# Patient Record
Sex: Female | Born: 1950 | Hispanic: No | Marital: Single | Smoking: Never smoker
Health system: Southern US, Community
[De-identification: ages and names within clinical notes are randomized; demographics above are authoritative.]

---

## 2017-12-14 ENCOUNTER — Emergency Department (HOSPITAL_COMMUNITY): Payer: Worker's Compensation

## 2017-12-14 ENCOUNTER — Emergency Department (HOSPITAL_COMMUNITY)
Admission: EM | Admit: 2017-12-14 | Discharge: 2017-12-14 | Disposition: A | Discharge status: Home / Self Care | Payer: Worker's Compensation | Attending: Emergency Medicine | Admitting: Emergency Medicine

## 2017-12-14 ENCOUNTER — Encounter (HOSPITAL_COMMUNITY): Payer: Self-pay | Admitting: Emergency Medicine

## 2017-12-14 DIAGNOSIS — Y998 Other external cause status: Secondary | ICD-10-CM | POA: Diagnosis not present

## 2017-12-14 DIAGNOSIS — Y939 Activity, unspecified: Secondary | ICD-10-CM | POA: Diagnosis not present

## 2017-12-14 DIAGNOSIS — W01198A Fall on same level from slipping, tripping and stumbling with subsequent striking against other object, initial encounter: Secondary | ICD-10-CM | POA: Diagnosis not present

## 2017-12-14 DIAGNOSIS — S43402A Unspecified sprain of left shoulder joint, initial encounter: Secondary | ICD-10-CM

## 2017-12-14 DIAGNOSIS — S4992XA Unspecified injury of left shoulder and upper arm, initial encounter: Secondary | ICD-10-CM | POA: Diagnosis present

## 2017-12-14 DIAGNOSIS — Y9289 Other specified places as the place of occurrence of the external cause: Secondary | ICD-10-CM | POA: Diagnosis not present

## 2017-12-14 MED ORDER — IBUPROFEN 800 MG PO TABS
800.0000 mg | ORAL_TABLET | Freq: Once | ORAL | Status: AC
  Administered 2017-12-14: 800 mg via ORAL
  Filled 2017-12-14: qty 1

## 2017-12-14 MED ORDER — IBUPROFEN 600 MG PO TABS: 600.0000 mg | ORAL_TABLET | Freq: Four times a day (QID) | ORAL | 0 refills | Status: DC | PRN

## 2017-12-14 MED ORDER — TRAMADOL HCL 50 MG PO TABS: 50.0000 mg | ORAL_TABLET | Freq: Four times a day (QID) | ORAL | 0 refills | Status: DC | PRN

## 2017-12-14 MED ORDER — TRAMADOL HCL 50 MG PO TABS
50.0000 mg | ORAL_TABLET | Freq: Once | ORAL | Status: AC
  Administered 2017-12-14: 50 mg via ORAL
  Filled 2017-12-14: qty 1

## 2017-12-14 NOTE — Discharge Instructions (Signed)
Take motrin for pain.   Take tramadol for severe pain.   You have a shoulder sprain. Wear sling for comfort.   Follow up with orthopedic doctor  Return to ER if you have worse shoulder pain, unable to move your shoulder

## 2017-12-14 NOTE — ED Notes (Signed)
[place] interpreter used for discharge instruction.

## 2017-12-14 NOTE — ED Triage Notes (Signed)
Patient reports fall at work this morning. C/o left shoulder pain. Left radial pulse present.

## 2017-12-14 NOTE — ED Provider Notes (Signed)
[facility]-EMERGENCY DEPT Provider Note   CSN: [id number] Arrival date & time: 12/14/17  2038     History   Chief Complaint Chief Complaint  Patient presents with  . Fall  . Shoulder Injury    HPI Jennesis Ramaswamy is a 67 y.o. female here presenting with left shoulder injury.  She states that she was at work this morning.  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  She has worsening pain as the day went on and came here for evaluation.  She denies any head injury or loss of consciousness.  She denies passing out.   The history is provided by the patient.    History reviewed. No pertinent past medical history.  There are no active problems to display for this patient.   History reviewed. No pertinent surgical history.   OB History   None      Home Medications    Prior to Admission medications   Not on File    Family History No family history on file.  Social History Social History   Tobacco Use  . Smoking status: Not on file  Substance Use Topics  . Alcohol use: Not on file  . Drug use: Not on file     Allergies   Patient has no known allergies.   Review of Systems Review of Systems  Musculoskeletal:       L shoulder pain   All other systems reviewed and are negative.    Physical Exam Updated Vital Signs BP 127/88   Pulse 85   Temp 97.9 F (36.6 C) (Oral)   Resp 17   Ht 5\' 4"  (1.626 m)   Wt 70.4 kg   SpO2 92%   BMI 26.64 kg/m   Physical Exam  Constitutional: She is oriented to person, place, and time.  Slightly uncomfortable   HENT:  Head: Normocephalic and atraumatic.  Mouth/Throat: Oropharynx is clear and moist.  Eyes: Pupils are equal, round, and reactive to light. Conjunctivae and EOM are normal.  Neck: Normal range of motion. Neck supple.  Cardiovascular: Normal rate, regular rhythm and normal heart sounds.    Pulmonary/Chest: Effort normal and breath sounds normal. No stridor. No respiratory distress.  Abdominal: Soft. Bowel sounds are normal. She exhibits no distension. There is no tenderness.  Musculoskeletal:  L deltoid tender. Able to range the L shoulder. No obvious shoulder or humerus deformity. Nl radial pulses, neurovascular intact L upper extremity   Neurological: She is alert and oriented to person, place, and time.  Skin: Skin is warm. Capillary refill takes less than 2 seconds.  Psychiatric: She has a normal mood and affect.  Nursing note and vitals reviewed.    ED Treatments / Results  Labs (all labs ordered are listed, but only abnormal results are displayed) Labs Reviewed - No data to display  EKG None  Radiology Dg Shoulder Left  Result Date: 12/14/2017 CLINICAL DATA:  Fall with shoulder pain EXAM: LEFT SHOULDER - 2+ VIEW COMPARISON:  None. FINDINGS: There is no evidence of fracture or dislocation. There is no evidence of arthropathy or other focal bone abnormality. Soft tissues are unremarkable. IMPRESSION: Negative. Electronically Signed   By: Jasmine Pang M.D.   On: 12/14/2017 21:27    Procedures Procedures (including critical care time)  Medications Ordered in ED Medications  traMADol (ULTRAM) tablet 50 mg (has no administration in time  range)     Initial Impression / Assessment and Plan / ED Course  I have reviewed the triage vital signs and the nursing notes.  Pertinent labs & imaging results that were available during my care of the patient were reviewed by me and considered in my medical decision making (see chart for details).    Sinclaire Artiga is a 67 y.o. female here with L shoulder injury. No obvious deformity, able to range shoulder. Xray showed no dislocation or fracture. Will put on shoulder sling. Likely shoulder sprain vs rotator cuff injury. Will dc home with motrin, tramadol prn. Will have her follow up with ortho outpatient.    Final  Clinical Impressions(s) / ED Diagnoses   Final diagnoses:  None    ED Discharge Orders    None       Charlynne Pander, MD 12/14/17 2155

## 2019-11-22 IMAGING — CR DG SHOULDER 2+V*L*
3 series · 3 of 3 positions shown · non-contrast
Comparison: None.

CLINICAL DATA: Fall with shoulder pain

EXAM:
LEFT SHOULDER - 2+ VIEW

[w shoulder external left]
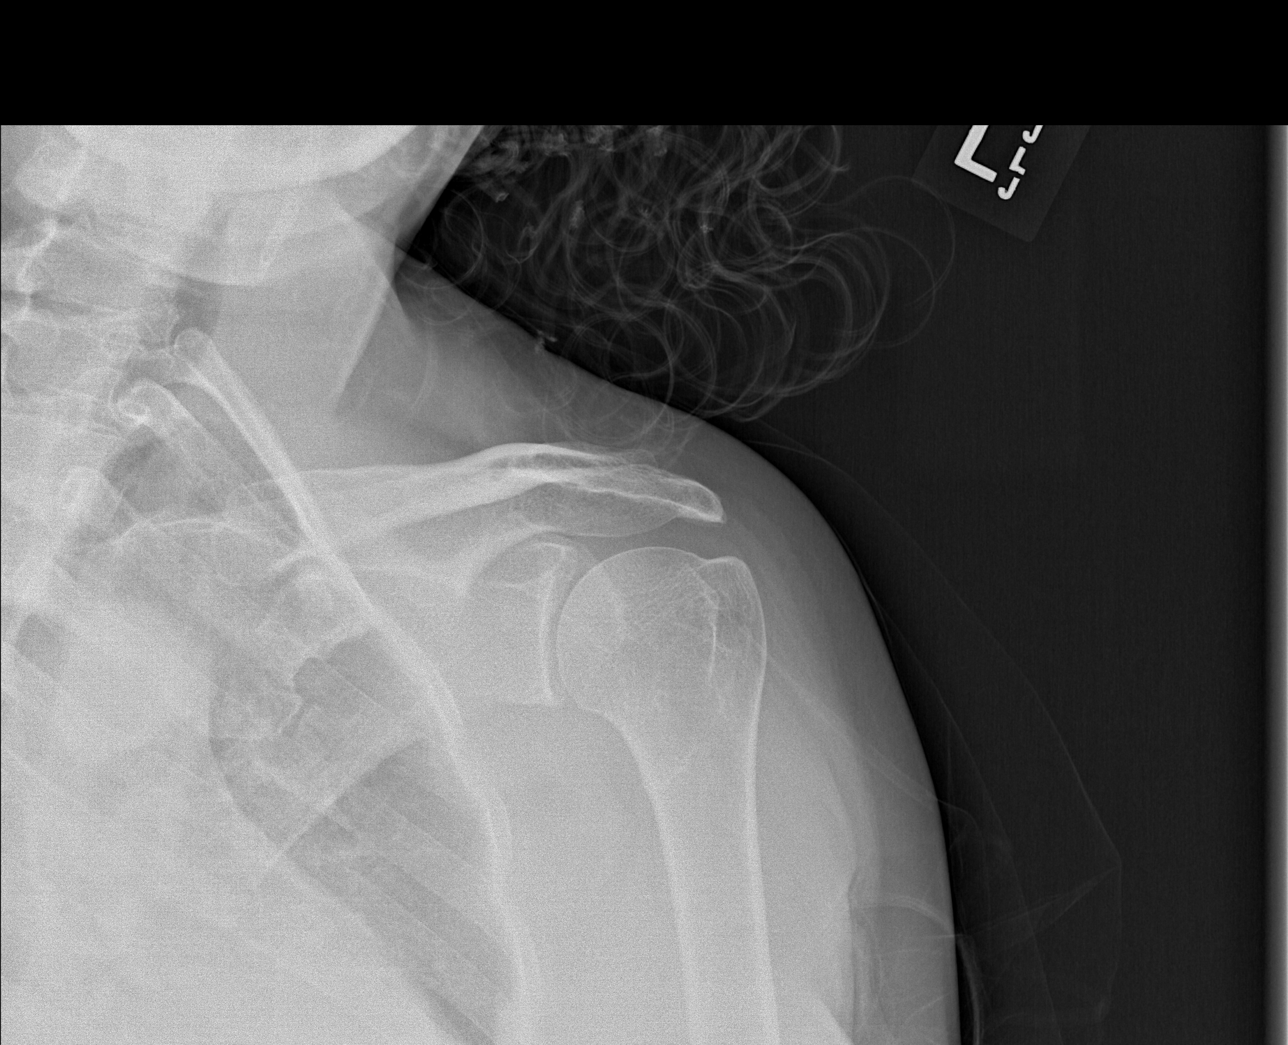

[w shoulder y-view left]
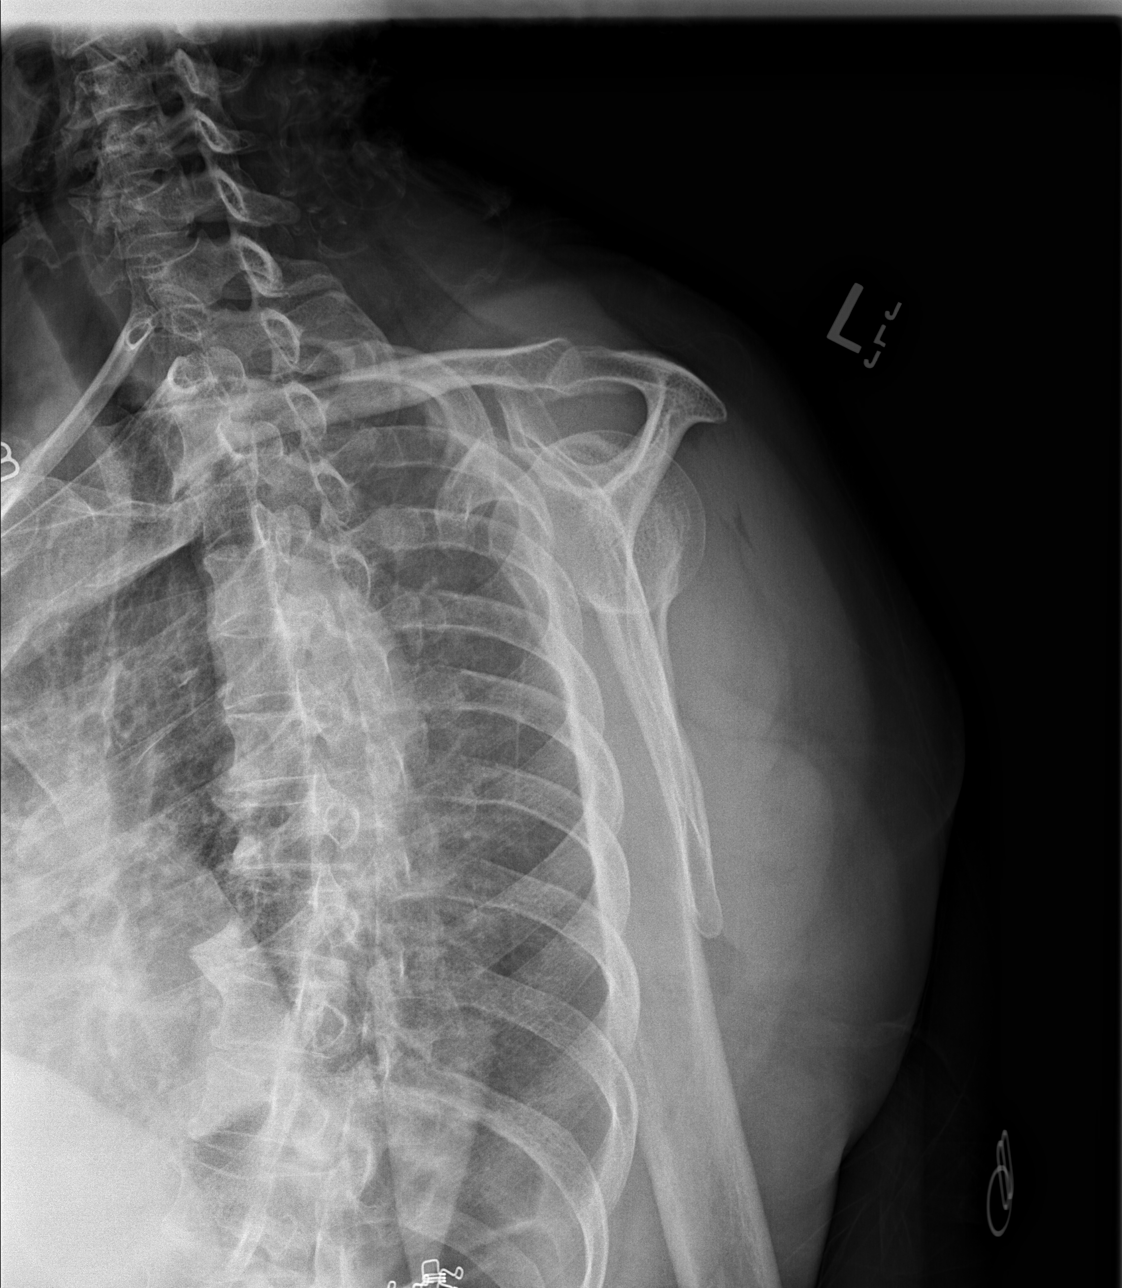

[x shoulder axillary left]
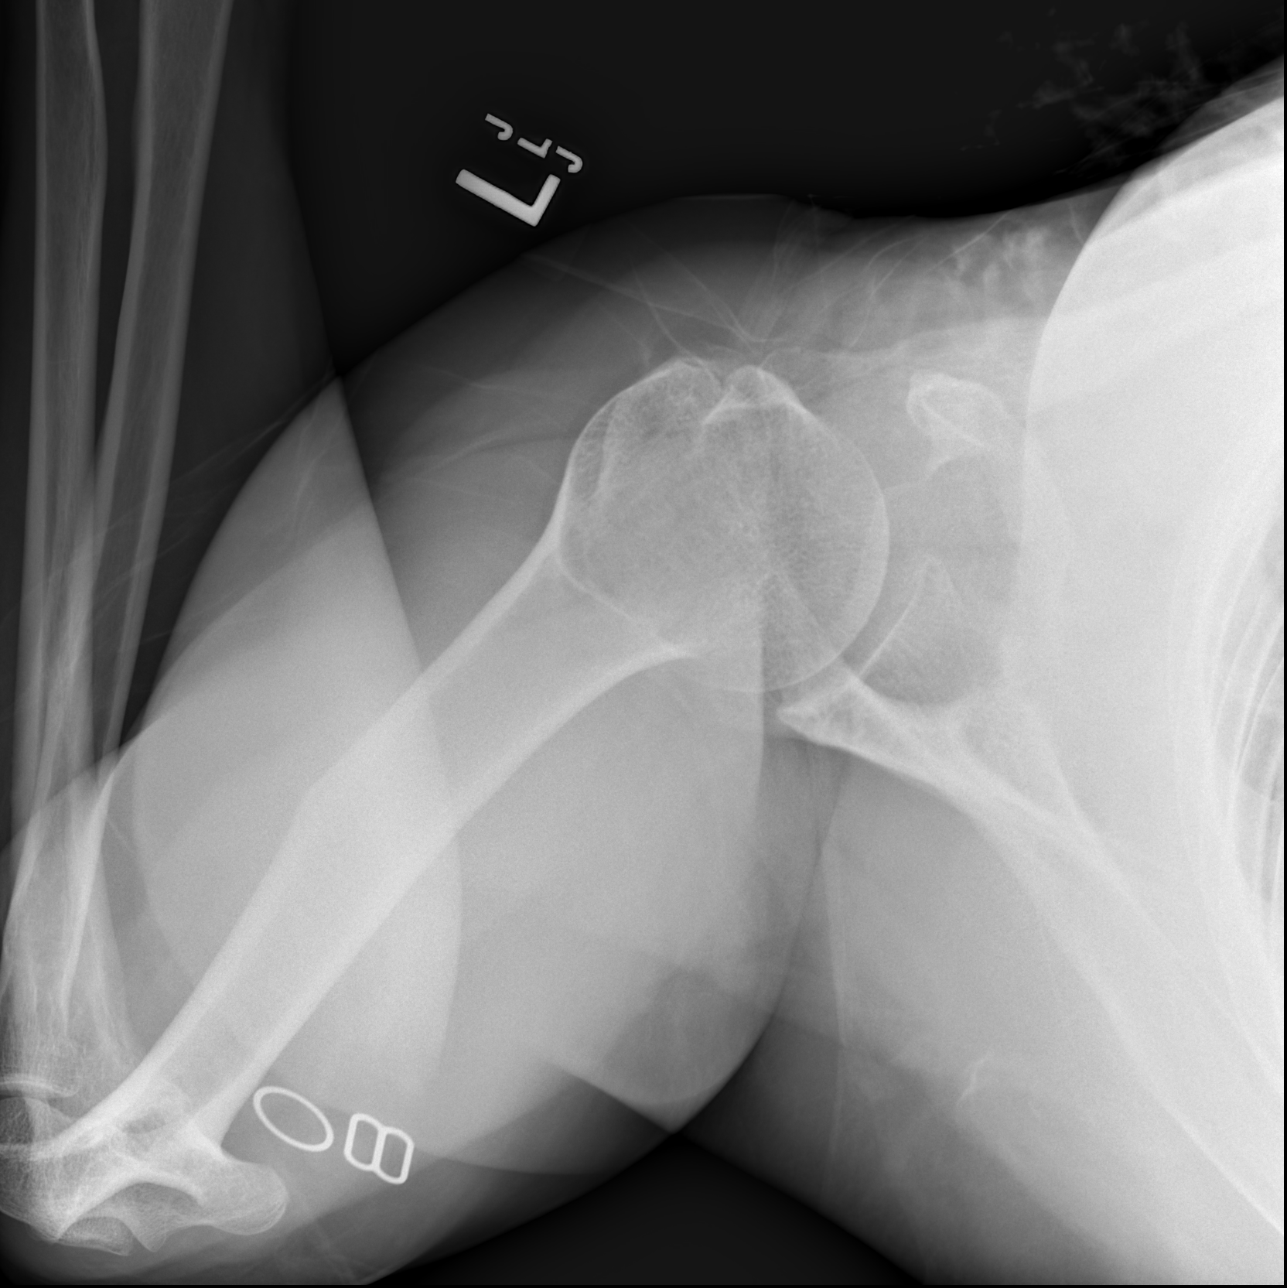

[3 of 3 positions shown; findings below may reference images not displayed]

FINDINGS: There is no evidence of fracture or dislocation. There is no
evidence of arthropathy or other focal bone abnormality. Soft
tissues are unremarkable.
IMPRESSION: Negative.

## 2021-06-12 ENCOUNTER — Encounter (HOSPITAL_COMMUNITY): Payer: Self-pay

## 2021-06-12 ENCOUNTER — Other Ambulatory Visit: Payer: Self-pay

## 2021-06-12 ENCOUNTER — Emergency Department (HOSPITAL_COMMUNITY)
Admission: EM | Admit: 2021-06-12 | Discharge: 2021-06-12 | Disposition: A | Discharge status: Home / Self Care | Payer: Commercial Managed Care - PPO | Attending: Emergency Medicine | Admitting: Emergency Medicine

## 2021-06-12 DIAGNOSIS — E1165 Type 2 diabetes mellitus with hyperglycemia: Secondary | ICD-10-CM | POA: Insufficient documentation

## 2021-06-12 DIAGNOSIS — Z7984 Long term (current) use of oral hypoglycemic drugs: Secondary | ICD-10-CM | POA: Diagnosis not present

## 2021-06-12 DIAGNOSIS — R739 Hyperglycemia, unspecified: Secondary | ICD-10-CM

## 2021-06-12 DIAGNOSIS — Z79899 Other long term (current) drug therapy: Secondary | ICD-10-CM | POA: Diagnosis not present

## 2021-06-12 DIAGNOSIS — I10 Essential (primary) hypertension: Secondary | ICD-10-CM | POA: Diagnosis not present

## 2021-06-12 LAB — BASIC METABOLIC PANEL
Anion gap: 10 (ref 5–15)
BUN: 13 mg/dL (ref 8–23)
CO2: 23 mmol/L (ref 22–32)
Calcium: 9.1 mg/dL (ref 8.9–10.3)
Chloride: 99 mmol/L (ref 98–111)
Creatinine, Ser: 0.71 mg/dL (ref 0.44–1.00)
GFR, Estimated: >60 mL/min (ref >60)
Glucose, Bld: 362 mg/dL — ABNORMAL HIGH (ref 70–99)
Potassium: 3.6 mmol/L (ref 3.5–5.1)
Sodium: 132 mmol/L — ABNORMAL LOW (ref 135–145)

## 2021-06-12 LAB — URINALYSIS, ROUTINE W REFLEX MICROSCOPIC
Bilirubin Urine: NEGATIVE
Glucose, UA: >=500 mg/dL — AB
Hgb urine dipstick: NEGATIVE
Ketones, ur: NEGATIVE mg/dL
Leukocytes,Ua: NEGATIVE
Nitrite: NEGATIVE
Protein, ur: NEGATIVE mg/dL
Specific Gravity, Urine: 1.003 — ABNORMAL LOW (ref 1.005–1.030)
pH: 6 (ref 5.0–8.0)

## 2021-06-12 LAB — CBC
HCT: 38.5 % (ref 36.0–46.0)
Hemoglobin: 12.7 g/dL (ref 12.0–15.0)
MCH: 28.9 pg (ref 26.0–34.0)
MCHC: 33 g/dL (ref 30.0–36.0)
MCV: 87.5 fL (ref 80.0–100.0)
Platelets: 165 10*3/uL (ref 150–400)
RBC: 4.4 MIL/uL (ref 3.87–5.11)
RDW: 11.9 % (ref 11.5–15.5)
WBC: 7.1 10*3/uL (ref 4.0–10.5)
nRBC: 0 % (ref 0.0–0.2)

## 2021-06-12 LAB — CBG MONITORING, ED
Glucose-Capillary: 306 mg/dL — ABNORMAL HIGH (ref 70–99)
Glucose-Capillary: 366 mg/dL — ABNORMAL HIGH (ref 70–99)

## 2021-06-12 MED ORDER — SODIUM CHLORIDE 0.9 % IV BOLUS
1000.0000 mL | Freq: Once | INTRAVENOUS | Status: AC
  Administered 2021-06-12: 1000 mL via INTRAVENOUS

## 2021-06-12 MED ORDER — METFORMIN HCL 500 MG PO TABS: 500.0000 mg | ORAL_TABLET | Freq: Two times a day (BID) | ORAL | 0 refills | Status: DC

## 2021-06-12 MED ORDER — METFORMIN HCL 500 MG PO TABS: 500.0000 mg | ORAL_TABLET | Freq: Once | ORAL | Status: DC

## 2021-06-12 MED ORDER — ACETAMINOPHEN 325 MG PO TABS
650.0000 mg | ORAL_TABLET | Freq: Once | ORAL | Status: AC
  Administered 2021-06-12: 650 mg via ORAL
  Filled 2021-06-12: qty 2

## 2021-06-12 MED ORDER — METFORMIN HCL 500 MG PO TABS
500.0000 mg | ORAL_TABLET | Freq: Once | ORAL | Status: AC
  Administered 2021-06-12: 500 mg via ORAL
  Filled 2021-06-12: qty 1

## 2021-06-12 NOTE — Discharge Instructions (Signed)
You were seen in the emergency department today for not feeling well with high blood sugar.  You were given fluids.  Your other labs were overall reassuring.  We are sending you home with a prescription for metformin, please start taking this again.  Please follow-up with your primary care provider soon as possible.  Return to the ER for new or worsening symptoms or any other concerns. ?

## 2021-06-12 NOTE — ED Notes (Signed)
RN reviewed discharge instructions with pt. Pt verbalized understanding and had no further questions. VSS upon discharge.  

## 2021-06-12 NOTE — ED Triage Notes (Signed)
Pt reports she is a type 2 diabetic and for the past x2 weeks has not had her metformin. Pt stated her glucose is "reading 400's at home". Pt reports headache. Pt denies any N&V. Pt denies any abd pain. Pt glucose is 300's in triage. ?

## 2021-06-12 NOTE — ED Provider Notes (Signed)
?[facility] EMERGENCY DEPARTMENT ?Provider Note ? ? ?CSN: [id number] ?Arrival date & time: 06/12/21  3151 ? ?  ? ?History ? ?Chief Complaint  ?Patient presents with  ? Hyperglycemia  ? ? ?Sally Gross is a 71 y.o. female with a hx of T2DM, hypertension, hyperlipidemia, anemia, and depression who presents to the ED with family for evaluation of hyperglycemia tonight. Patient reports she woke up feeling poorly with a headache, checked her blood sugar and it was elevated in the 400s prompting ED visit. She took some cinnamon and states she is feeling better. She states that she has similar sxs when her sugar is high with headaches and a times blurry vision. She does not check her sugar regularly only if she is feeling poorly. She has been out of her metformin for 1-2 weeks. Denies diplopia, vision loss, numbness, weakness, fever, chest pain, dyspnea, or abdominal pain. Family supplemented history and translated at times, patient declined formal translator.  ? ?HPI ? ?  ? ?Home Medications ?Prior to Admission medications   ?Medication Sig Start Date End Date Taking? Authorizing Provider  ?ibuprofen (ADVIL,MOTRIN) 600 MG tablet Take 1 tablet (600 mg total) by mouth every 6 (six) hours as needed. ?Patient not taking: Reported on 06/12/2021 12/14/17   Charlynne Pander, MD  ?traMADol (ULTRAM) 50 MG tablet Take 1 tablet (50 mg total) by mouth every 6 (six) hours as needed. ?Patient not taking: Reported on 06/12/2021 12/14/17   Charlynne Pander, MD  ?   ? ?Allergies    ?Patient has no known allergies.   ? ?Review of Systems   ?Review of Systems  ?Constitutional:  Negative for chills and fever.  ?Respiratory:  Negative for shortness of breath.   ?Cardiovascular:  Negative for chest pain.  ?Gastrointestinal:  Negative for abdominal pain, blood in stool, nausea and vomiting.  ?Genitourinary:  Negative for dysuria.  ?Neurological:  Positive for headaches. Negative for syncope.  ?All other systems reviewed and  are negative. ? ?Physical Exam ?Updated Vital Signs ?BP 133/79   Pulse 61   Temp 97.7 ?F (36.5 ?C) (Oral)   Resp 17   SpO2 97%  ?Physical Exam ?Vitals and nursing note reviewed.  ?Constitutional:   ?   General: She is not in acute distress. ?   Appearance: Normal appearance. She is not toxic-appearing.  ?HENT:  ?   Head: Normocephalic and atraumatic.  ?   Mouth/Throat:  ?   Pharynx: Oropharynx is clear. Uvula midline.  ?Eyes:  ?   General: Vision grossly intact. Gaze aligned appropriately.  ?   Extraocular Movements: Extraocular movements intact.  ?   Conjunctiva/sclera: Conjunctivae normal.  ?   Pupils: Pupils are equal, round, and reactive to light.  ?   Comments: No proptosis.   ?Cardiovascular:  ?   Rate and Rhythm: Normal rate and regular rhythm.  ?Pulmonary:  ?   Effort: Pulmonary effort is normal.  ?   Breath sounds: Normal breath sounds.  ?Abdominal:  ?   General: There is no distension.  ?   Palpations: Abdomen is soft.  ?   Tenderness: There is no abdominal tenderness. There is no guarding or rebound.  ?Musculoskeletal:  ?   Cervical back: Normal range of motion and neck supple. No rigidity.  ?Skin: ?   General: Skin is warm and dry.  ?Neurological:  ?   Mental Status: She is alert.  ?   Comments: Alert. Clear speech. No facial droop. CNIII-XII grossly intact. Bilateral  upper and lower extremities' sensation grossly intact. 5/5 symmetric strength with grip strength and with plantar and dorsi flexion bilaterally . Normal finger to nose bilaterally. Negative pronator drift. Gait intact.  ?  ?Psychiatric:     ?   Mood and Affect: Mood normal.     ?   Behavior: Behavior normal.  ? ? ?ED Results / Procedures / Treatments   ?Labs ?(all labs ordered are listed, but only abnormal results are displayed) ?Labs Reviewed  ?BASIC METABOLIC PANEL - Abnormal; Notable for the following components:  ?    Result Value  ? Sodium 132 (*)   ? Glucose, Bld 362 (*)   ? All other components within normal limits  ?URINALYSIS,  ROUTINE W REFLEX MICROSCOPIC - Abnormal; Notable for the following components:  ? Color, Urine COLORLESS (*)   ? Specific Gravity, Urine 1.003 (*)   ? Glucose, UA >=500 (*)   ? Bacteria, UA RARE (*)   ? All other components within normal limits  ?CBG MONITORING, ED - Abnormal; Notable for the following components:  ? Glucose-Capillary 306 (*)   ? All other components within normal limits  ?CBC  ?CBG MONITORING, ED  ? ? ?EKG ?None ? ?Radiology ?No results found. ? ?Procedures ?Procedures  ? ? ?Medications Ordered in ED ?Medications  ?sodium chloride 0.9 % bolus 1,000 mL (1,000 mLs Intravenous New Bag/Given 06/12/21 0422)  ?acetaminophen (TYLENOL) tablet 650 mg (650 mg Oral Given 06/12/21 0423)  ? ? ?ED Course/ Medical Decision Making/ A&P ?  ?                        ?Medical Decision Making ?Amount and/or Complexity of Data Reviewed ?Labs: ordered. ? ?Risk ?OTC drugs. ?Prescription drug management. ? ? ?Patient presents to the ED with complaints of hyperglycemia, this involves an extensive number of treatment options, and is a complaint that carries with it a high risk of complications and morbidity. Nontoxic, vitals w/ elevated BP initially- normalized w/ repeat vital signs.   ? ?Additional history obtained:  ?Chart & nursing note reviewed.  ?Patient's family.  ? ?Lab Tests:  ?I reviewed & interpreted labs including:  ?CBC: Unremarkable.  ?BMP: hyperglycemia without acidosis or anion gap elevation.  ?UA: No UTI, glucosuria without ketonuria.  ? ?ED Course:  ?Patient given fluids, Tylenol, and a dose of her metformin in the emergency department.  Her work-up has been overall reassuring, labs/presentation not consistent with DKA or HHS.  No critical electrolyte derangement.  No focal neurologic deficits on exam and her headache was fairly resolved prior to ED arrival.  She is unsure of the dose of metformin, upon external record review for additional history most recent PCP note from 2021 shows that she was on 500 mg  twice daily, will restart this and have her follow-up very closely with her primary care provider. I discussed results, treatment plan, need for follow-up, and return precautions with the patient and faily. Provided opportunity for questions, patient & family confirmed understanding and are in agreement with plan.  ? ?Portions of this note were generated with Scientist, clinical (histocompatibility and immunogenetics). Dictation errors may occur despite best attempts at proofreading. ? ? ?Final Clinical Impression(s) / ED Diagnoses ?Final diagnoses:  ?Hyperglycemia  ? ? ?Rx / DC Orders ?ED Discharge Orders   ? ?      Ordered  ?  metFORMIN (GLUCOPHAGE) 500 MG tablet  2 times daily with meals       ?  06/12/21 0625  ? ?  ?  ? ?  ? ? ?  ?Cherly Andersonetrucelli, Judithe Keetch R, PA-C ?06/12/21 [phone] ? ?  ?Sabas SousBero, Michael M, MD ?06/12/21 [phone] ? ?

## 2022-01-12 ENCOUNTER — Emergency Department (HOSPITAL_COMMUNITY)
Admission: EM | Admit: 2022-01-12 | Discharge: 2022-01-12 | Disposition: A | Discharge status: Home / Self Care | Payer: Commercial Managed Care - PPO | Attending: Emergency Medicine | Admitting: Emergency Medicine

## 2022-01-12 ENCOUNTER — Other Ambulatory Visit: Payer: Self-pay

## 2022-01-12 DIAGNOSIS — I10 Essential (primary) hypertension: Secondary | ICD-10-CM | POA: Insufficient documentation

## 2022-01-12 DIAGNOSIS — R739 Hyperglycemia, unspecified: Secondary | ICD-10-CM

## 2022-01-12 DIAGNOSIS — Z7984 Long term (current) use of oral hypoglycemic drugs: Secondary | ICD-10-CM | POA: Insufficient documentation

## 2022-01-12 DIAGNOSIS — Z760 Encounter for issue of repeat prescription: Secondary | ICD-10-CM | POA: Insufficient documentation

## 2022-01-12 DIAGNOSIS — E1165 Type 2 diabetes mellitus with hyperglycemia: Secondary | ICD-10-CM | POA: Diagnosis present

## 2022-01-12 LAB — URINALYSIS, ROUTINE W REFLEX MICROSCOPIC
Bilirubin Urine: NEGATIVE
Glucose, UA: 50 mg/dL — AB
Hgb urine dipstick: NEGATIVE
Ketones, ur: NEGATIVE mg/dL
Leukocytes,Ua: NEGATIVE
Nitrite: NEGATIVE
Protein, ur: NEGATIVE mg/dL
Specific Gravity, Urine: 1.004 — ABNORMAL LOW (ref 1.005–1.030)
pH: 7 (ref 5.0–8.0)

## 2022-01-12 LAB — CBC
HCT: 39.3 % (ref 36.0–46.0)
Hemoglobin: 13.1 g/dL (ref 12.0–15.0)
MCH: 29.6 pg (ref 26.0–34.0)
MCHC: 33.3 g/dL (ref 30.0–36.0)
MCV: 88.9 fL (ref 80.0–100.0)
Platelets: 158 10*3/uL (ref 150–400)
RBC: 4.42 MIL/uL (ref 3.87–5.11)
RDW: 11.4 % — ABNORMAL LOW (ref 11.5–15.5)
WBC: 7.3 10*3/uL (ref 4.0–10.5)
nRBC: 0 % (ref 0.0–0.2)

## 2022-01-12 LAB — BASIC METABOLIC PANEL
Anion gap: 14 (ref 5–15)
BUN: 8 mg/dL (ref 8–23)
CO2: 23 mmol/L (ref 22–32)
Calcium: 9.3 mg/dL (ref 8.9–10.3)
Chloride: 98 mmol/L (ref 98–111)
Creatinine, Ser: 0.66 mg/dL (ref 0.44–1.00)
GFR, Estimated: >60 mL/min (ref >60)
Glucose, Bld: 249 mg/dL — ABNORMAL HIGH (ref 70–99)
Potassium: 3.7 mmol/L (ref 3.5–5.1)
Sodium: 135 mmol/L (ref 135–145)

## 2022-01-12 LAB — CBG MONITORING, ED
Glucose-Capillary: 247 mg/dL — ABNORMAL HIGH (ref 70–99)
Glucose-Capillary: 225 mg/dL — ABNORMAL HIGH (ref 70–99)

## 2022-01-12 MED ORDER — METFORMIN HCL 500 MG PO TABS: 500.0000 mg | ORAL_TABLET | Freq: Two times a day (BID) | ORAL | 0 refills | Status: AC

## 2022-01-12 MED ORDER — METFORMIN HCL 500 MG PO TABS
500.0000 mg | ORAL_TABLET | Freq: Once | ORAL | Status: AC
Start: 2022-01-12 — End: 2022-01-12
  Administered 2022-01-12: 500 mg via ORAL
  Filled 2022-01-12: qty 1

## 2022-01-12 MED ORDER — SODIUM CHLORIDE 0.9 % IV BOLUS
500.0000 mL | Freq: Once | INTRAVENOUS | Status: AC
  Administered 2022-01-12: 500 mL via INTRAVENOUS

## 2022-01-12 NOTE — ED Triage Notes (Signed)
Pt reports high blood sugars today in the high 300s  Noticed this after she became dizzy and had nausea/vomiting. No CP, fever, chills, or diarrhea. No falls

## 2022-01-12 NOTE — ED Provider Notes (Signed)
Accepted handoff at shift change from Army Melia PA-C. Please see prior provider note for more detail.   Briefly: Patient is 71 y.o.   DDX: concern for generalized weakness, high blood sugar in the high 300s, some intermittent nausea, vomiting, but denies chest pain, fever, chills, diarrhea.  At time of handoff patient with no focal or unilateral neurologic deficits.  She is indeed hyperglycemic, however blood sugar improving after gentle fluid bolus.  We are pending urinalysis.  I independently interpreted urinalysis which shows no evidence of urinary tract infection at this time.  Plan: Patient be discharged with refill of her metformin, and close PCP follow-up.  Patient understands and agrees to plan, and is discharged in stable condition at this time.  Reports weakness improved, and is tolerating p.o. at time of discharge.      RISR  EDTHIS    Olene Floss, PA-C 01/12/22 2349    Nira Conn, MD 01/14/22 1820

## 2022-01-12 NOTE — ED Provider Notes (Signed)
[facility] EMERGENCY DEPARTMENT Provider Note   CSN: [id number] Arrival date & time: 01/12/22  2105     History  Chief Complaint  Patient presents with   Hyperglycemia    Sally Gross is a 71 y.o. female.  71 year old female with history of hyperlipidemia and diabetes brought in by sister with concern for not feeling well today.  Patient states she is been out of her metformin for the past month, out of her cholesterol medication for the last 2 weeks.  Patient states that she went to her doctor however new prescriptions were not sent to her pharmacy for unknown reasons.  States her normal blood sugar runs in the 120s, today CBG 300 at home, she felt dizzy (light headed), generally weak, nauseous which prompted her to come to the ER.   Patient's sister translates for her, patient declines formal translator.       Home Medications Prior to Admission medications   Medication Sig Start Date End Date Taking? Authorizing Provider  metFORMIN (GLUCOPHAGE) 500 MG tablet Take 1 tablet (500 mg total) by mouth 2 (two) times daily with a meal. 01/12/22   Jeannie Fend, PA-C      Allergies    Patient has no known allergies.    Review of Systems   Review of Systems Negative except as per HPI Physical Exam Updated Vital Signs BP (!) 143/76   Pulse (!) 55   Temp 98.1 F (36.7 C)   Resp 17   SpO2 99%  Physical Exam Vitals and nursing note reviewed.  Constitutional:      General: She is not in acute distress.    Appearance: She is well-developed. She is not diaphoretic.  HENT:     Head: Normocephalic and atraumatic.  Eyes:     Extraocular Movements: Extraocular movements intact.     Pupils: Pupils are equal, round, and reactive to light.  Cardiovascular:     Rate and Rhythm: Normal rate and regular rhythm.     Pulses: Normal pulses.     Heart sounds: Normal heart sounds.  Pulmonary:     Effort: Pulmonary effort is normal.     Breath sounds: Normal  breath sounds.  Abdominal:     Palpations: Abdomen is soft.     Tenderness: There is no abdominal tenderness.  Musculoskeletal:     Right lower leg: No edema.     Left lower leg: No edema.  Skin:    General: Skin is warm and dry.     Findings: No erythema or rash.  Neurological:     Mental Status: She is alert and oriented to person, place, and time.     Cranial Nerves: No cranial nerve deficit.     Sensory: No sensory deficit.     Motor: No weakness.  Psychiatric:        Behavior: Behavior normal.     ED Results / Procedures / Treatments   Labs (all labs ordered are listed, but only abnormal results are displayed) Labs Reviewed  BASIC METABOLIC PANEL - Abnormal; Notable for the following components:      Result Value   Glucose, Bld 249 (*)    All other components within normal limits  CBC - Abnormal; Notable for the following components:   RDW 11.4 (*)    All other components within normal limits  CBG MONITORING, ED - Abnormal; Notable for the following components:   Glucose-Capillary 247 (*)    All other components within normal limits  CBG MONITORING, ED - Abnormal; Notable for the following components:   Glucose-Capillary 225 (*)    All other components within normal limits  URINALYSIS, ROUTINE W REFLEX MICROSCOPIC    EKG EKG Interpretation  Date/Time:  Thursday January 12 2022 22:29:23 EST Ventricular Rate:  57 PR Interval:  138 QRS Duration: 87 QT Interval:  451 QTC Calculation: 440 R Axis:   39 Text Interpretation: Sinus or ectopic atrial rhythm Confirmed by Lorre Nick ([id number]) on 01/12/2022 11:17:52 PM  Radiology No results found.  Procedures Procedures    Medications Ordered in ED Medications  sodium chloride 0.9 % bolus 500 mL (0 mLs Intravenous Stopped 01/12/22 2255)  metFORMIN (GLUCOPHAGE) tablet 500 mg (500 mg Oral Given 01/12/22 2307)    ED Course/ Medical Decision Making/ A&P Clinical Course as of 01/12/22 2332  Thu Jan 12, 2022   2331 Ran out of meds including metformin, had BG of 300. Came in. Generalized weakness no unilateral or focal symptoms. Pending UA, okay to DC with refill metformin and PCP follow up. [CP]    Clinical Course User Index [CP] Olene Floss, PA-C                           Medical Decision Making Amount and/or Complexity of Data Reviewed Labs: ordered.  Risk Prescription drug management.   This patient presents to the ED for concern of feeling lightheaded, nauseous, out of medications, this involves an extensive number of treatment options, and is a complaint that carries with it a high risk of complications and morbidity.  The differential diagnosis includes but not limited to hyperglycemia, metabolic disturbance, dehydration, HHS, DKA   Co morbidities that complicate the patient evaluation  Diabetes, hyperlipidemia, hypertension, anemia, depression   Additional history obtained:  Additional history obtained from family at bedside who assist with history as above External records from outside source obtained and reviewed including prior ER visit with similar complaints dated 06/12/2021 including labs for this visit for comparison today   Lab Tests:  I Ordered, and personally interpreted labs.  The pertinent results include: CBC unremarkable, BMP with glucose of 249.  Repeat glucose is 225.   Cardiac Monitoring: / EKG:  The patient was maintained on a cardiac monitor.  I personally viewed and interpreted the cardiac monitored which showed an underlying rhythm of: sinus rhythm rate 57   Problem List / ED Course / Critical interventions / Medication management  71 year old female presents with concern for elevated blood sugar and feeling poorly.  She is well-appearing on exam, equal arm and leg strength, sensation intact, cranial nerve exam is unremarkable.  Labs reveal hyperglycemia with glucose 249, improved to 225 after IV fluids.  She is provided with home dose of  metformin.  She is not orthostatic. I ordered medication including metformin, IV fluids for hyperglycemia Reevaluation of the patient after these medicines showed that the patient improved I have reviewed the patients home medicines and have made adjustments as needed   Social Determinants of Health:  Lives with family, has PCP   Test / Admission - Considered:  Disposition pending at time of sign out to on coming provider pending UA.          Final Clinical Impression(s) / ED Diagnoses Final diagnoses:  Hyperglycemia  Medication refill    Rx / DC Orders ED Discharge Orders          Ordered    metFORMIN (GLUCOPHAGE) 500 MG tablet  2 times daily with meals        01/12/22 2235              Alden Hipp 01/12/22 2332    Lorre Nick, MD 01/13/22 [phone]

## 2022-01-12 NOTE — Discharge Instructions (Signed)
Follow up with your doctor for refill of your medications and management of your health. Return to the ER for worsening or concerning symptoms.
